# Patient Record
Sex: Female | Born: 1955 | Race: White | Hispanic: No | Marital: Married | Smoking: Never smoker
Health system: Southern US, Community
[De-identification: ages and names within clinical notes are randomized; demographics above are authoritative.]

---

## 2004-10-26 ENCOUNTER — Ambulatory Visit (HOSPITAL_COMMUNITY): Admission: RE | Admit: 2004-10-26 | Discharge: 2004-10-26 | Payer: Self-pay | Admitting: Chiropractic Medicine

## 2005-09-07 IMAGING — CR DG CERVICAL SPINE COMPLETE 4+V
6 series · 6 of 6 positions shown · non-contrast
Comparison: none

CLINICAL DATA: Neck pain, no known injury.  
 CERVICAL SPINE ? 5 VIEW:
 Normal alignment and no fracture.  There is mild cervical kyphosis.  The disc spaces are well maintained and there is no significant spurring.

[w c-spine lat]
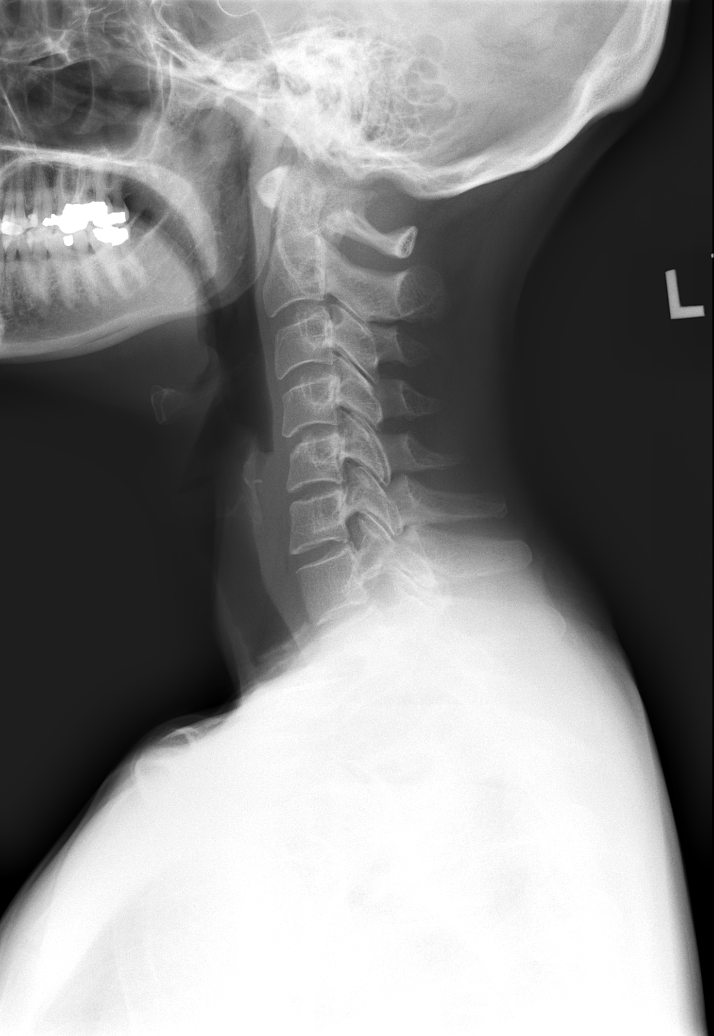

[w c-spine oblique (1 of 2)]
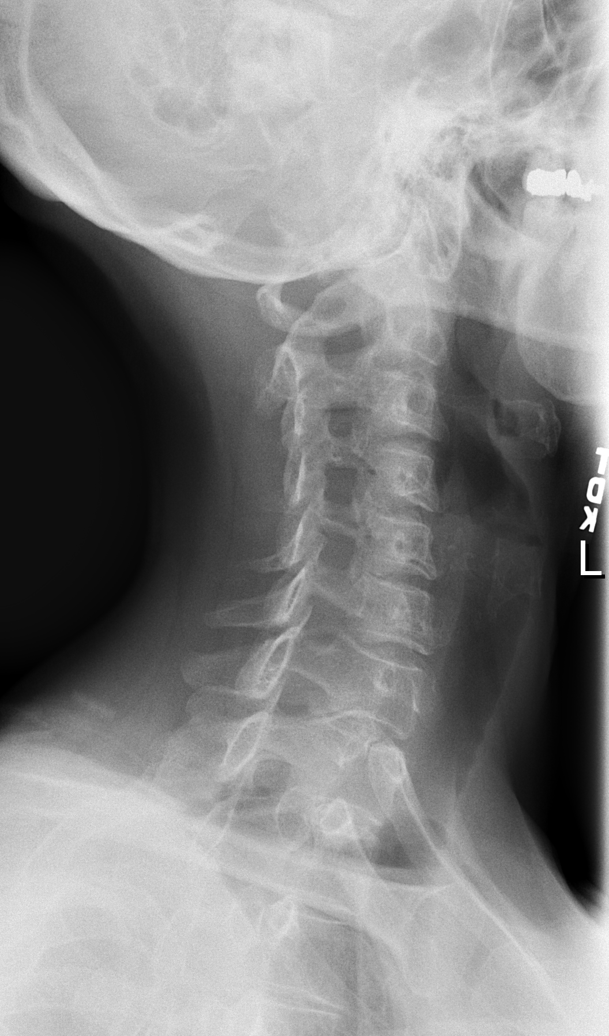

[w c-spine oblique (2 of 2)]
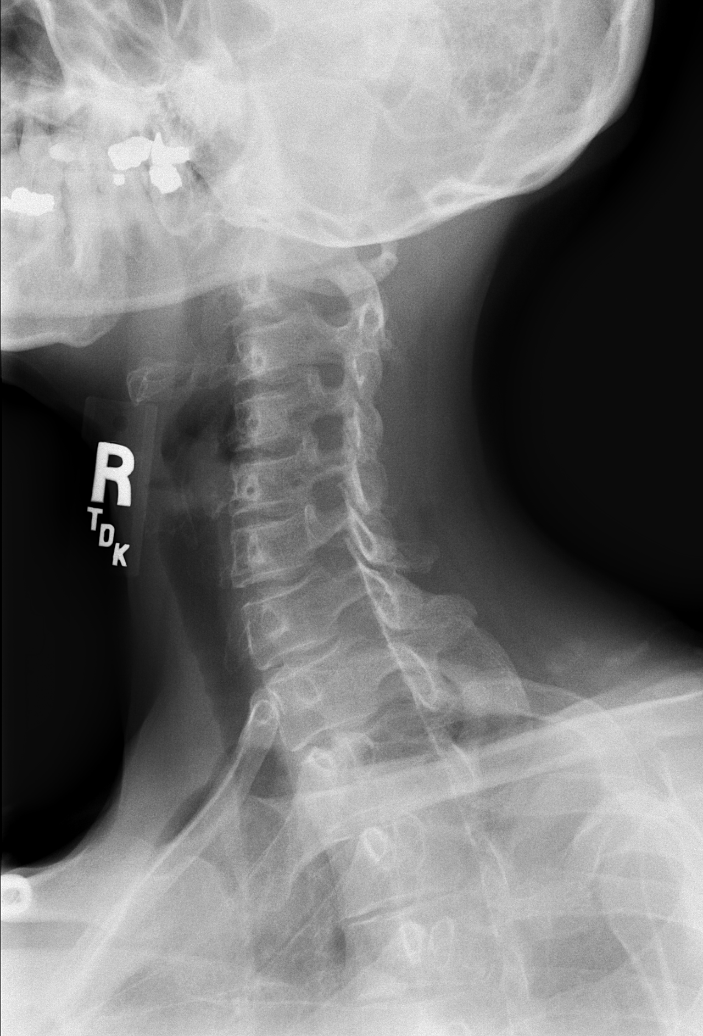

[w c-spine a.p. *]
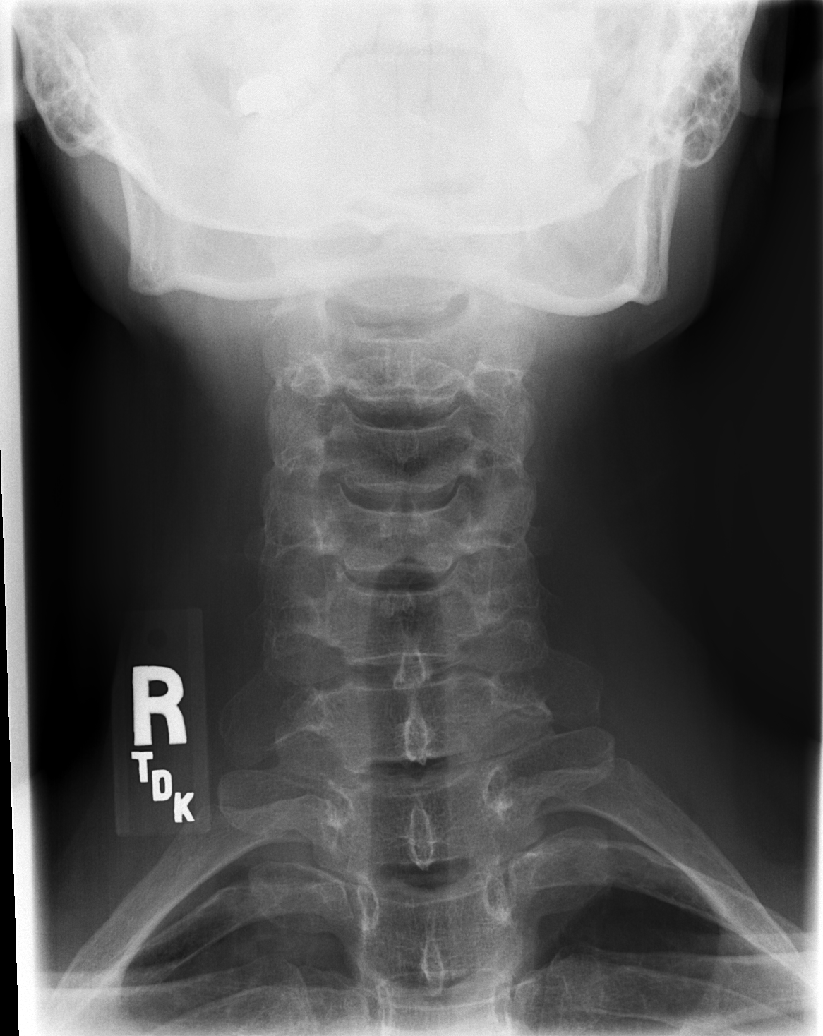

[w c-spine odontoid *]
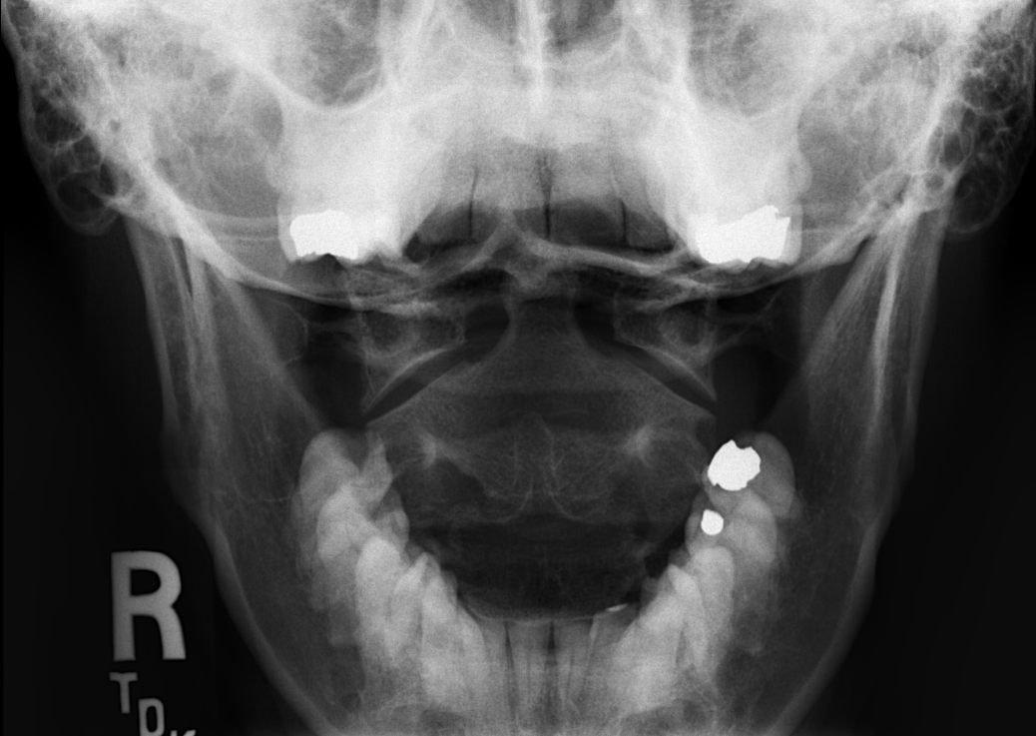

[w swimmers view *]
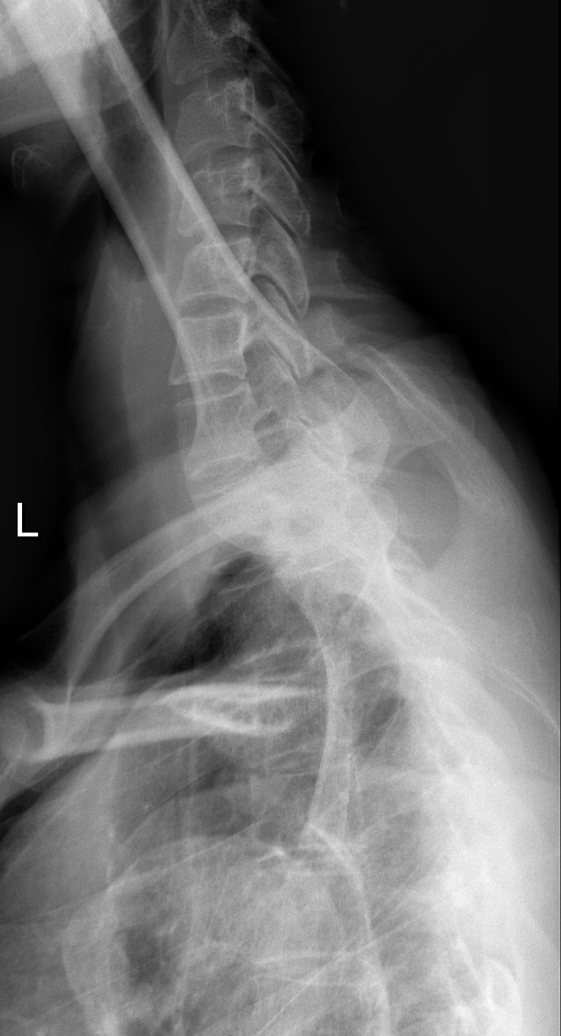

[6 of 6 positions shown; findings below may reference images not displayed]

IMPRESSION: Straightening of the cervical spine without significant degenerative change or fracture. 
 LUMBAR SPINE ? 4 VIEW: 
 Normal alignment and no fracture.  There is facet arthropathy on the left on at L5-S1 but I don?t believe there is a pars defect. Disc spaces are well maintained.
IMPRESSION: No acute bony abnormality. 
 RIGHT SHOULDER - 3 VIEW: 
 Normal alignment and no fracture.  There is mild degenerative change in the AC joint with very mild spurring present.  The glenohumeral joint is intact.
IMPRESSION: No acute bony abnormality.  Mild AC degeneration.

## 2005-09-07 IMAGING — CR DG SHOULDER 2+V*R*
3 series · 3 of 3 positions shown · non-contrast
Comparison: none

CLINICAL DATA: Neck pain, no known injury.  
 CERVICAL SPINE ? 5 VIEW:
 Normal alignment and no fracture.  There is mild cervical kyphosis.  The disc spaces are well maintained and there is no significant spurring.

[w shoulder ap external right *]
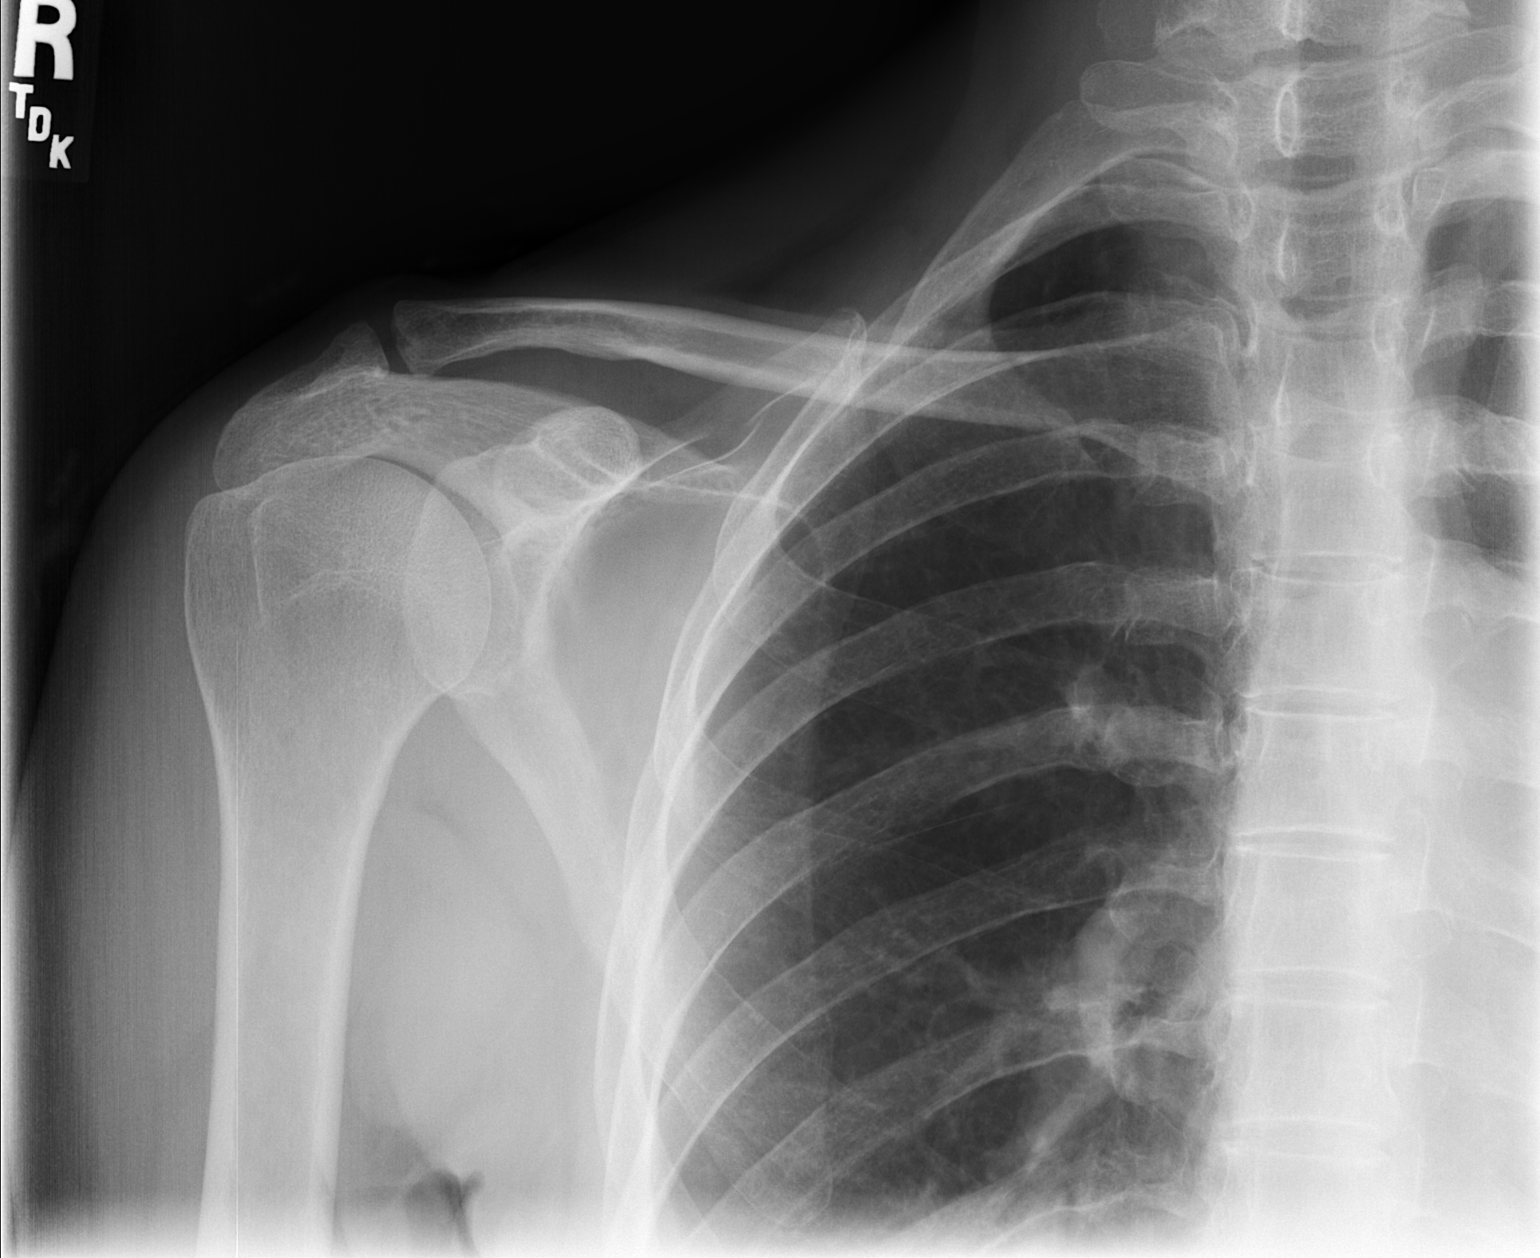

[w shoulder ap internal right *]
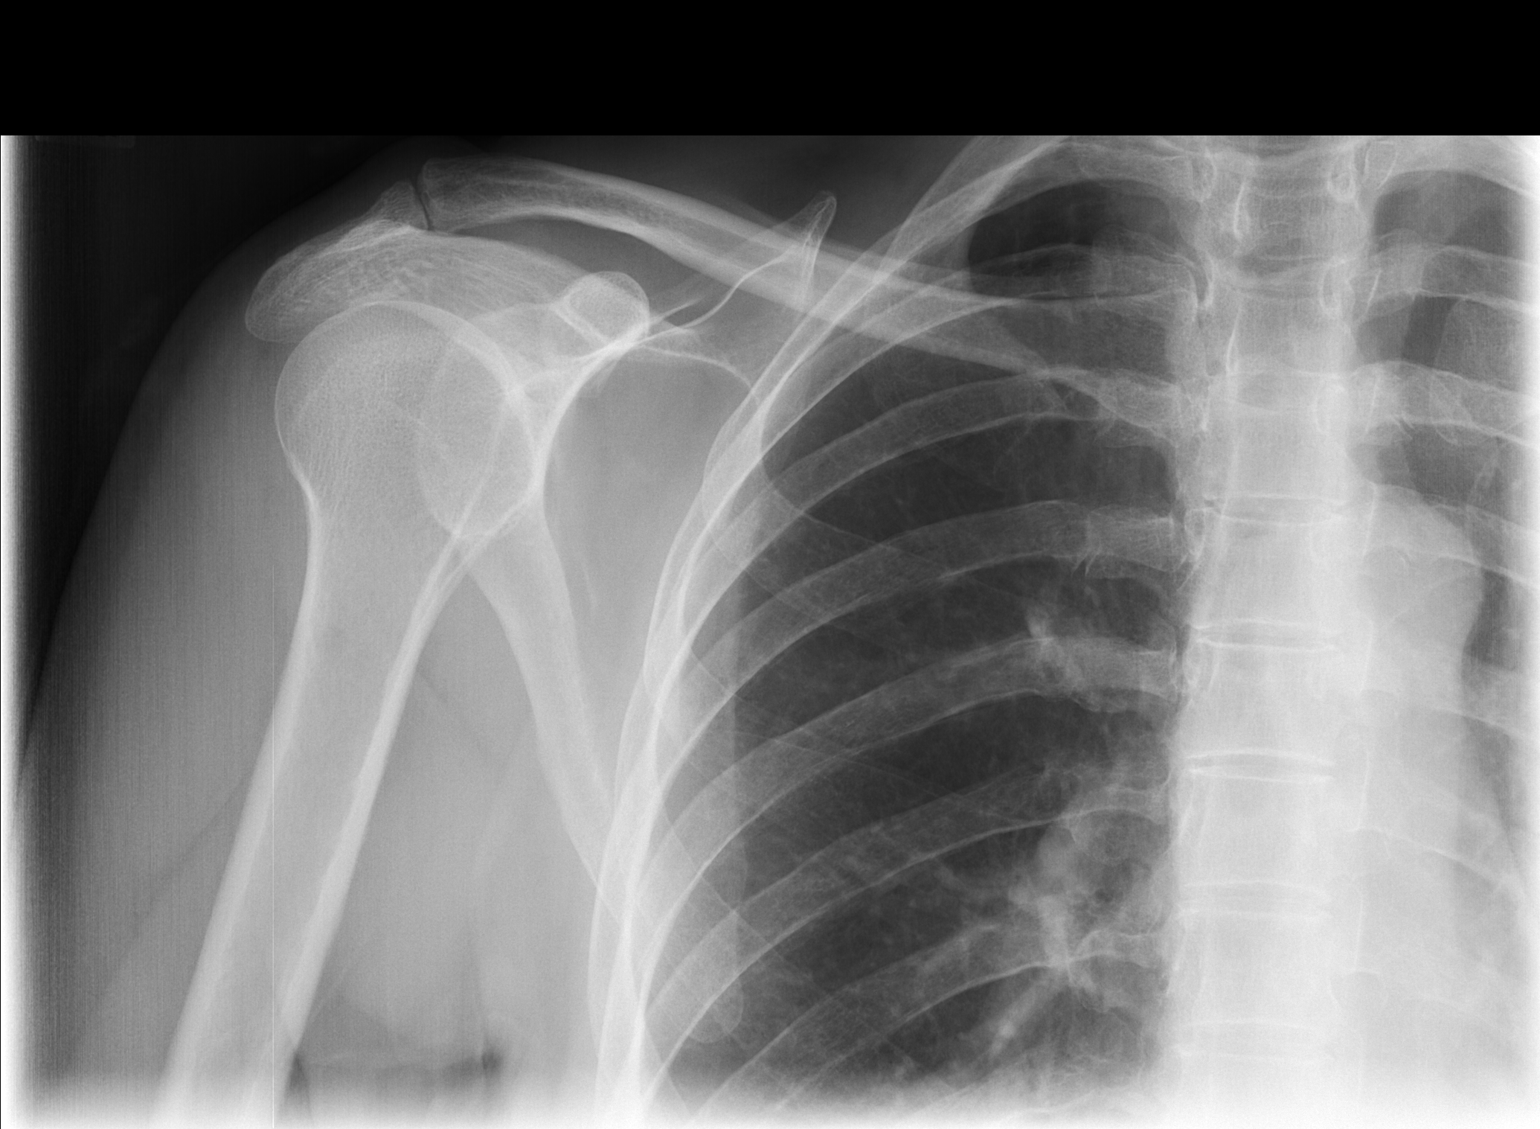

[t shoulder axillary right *]
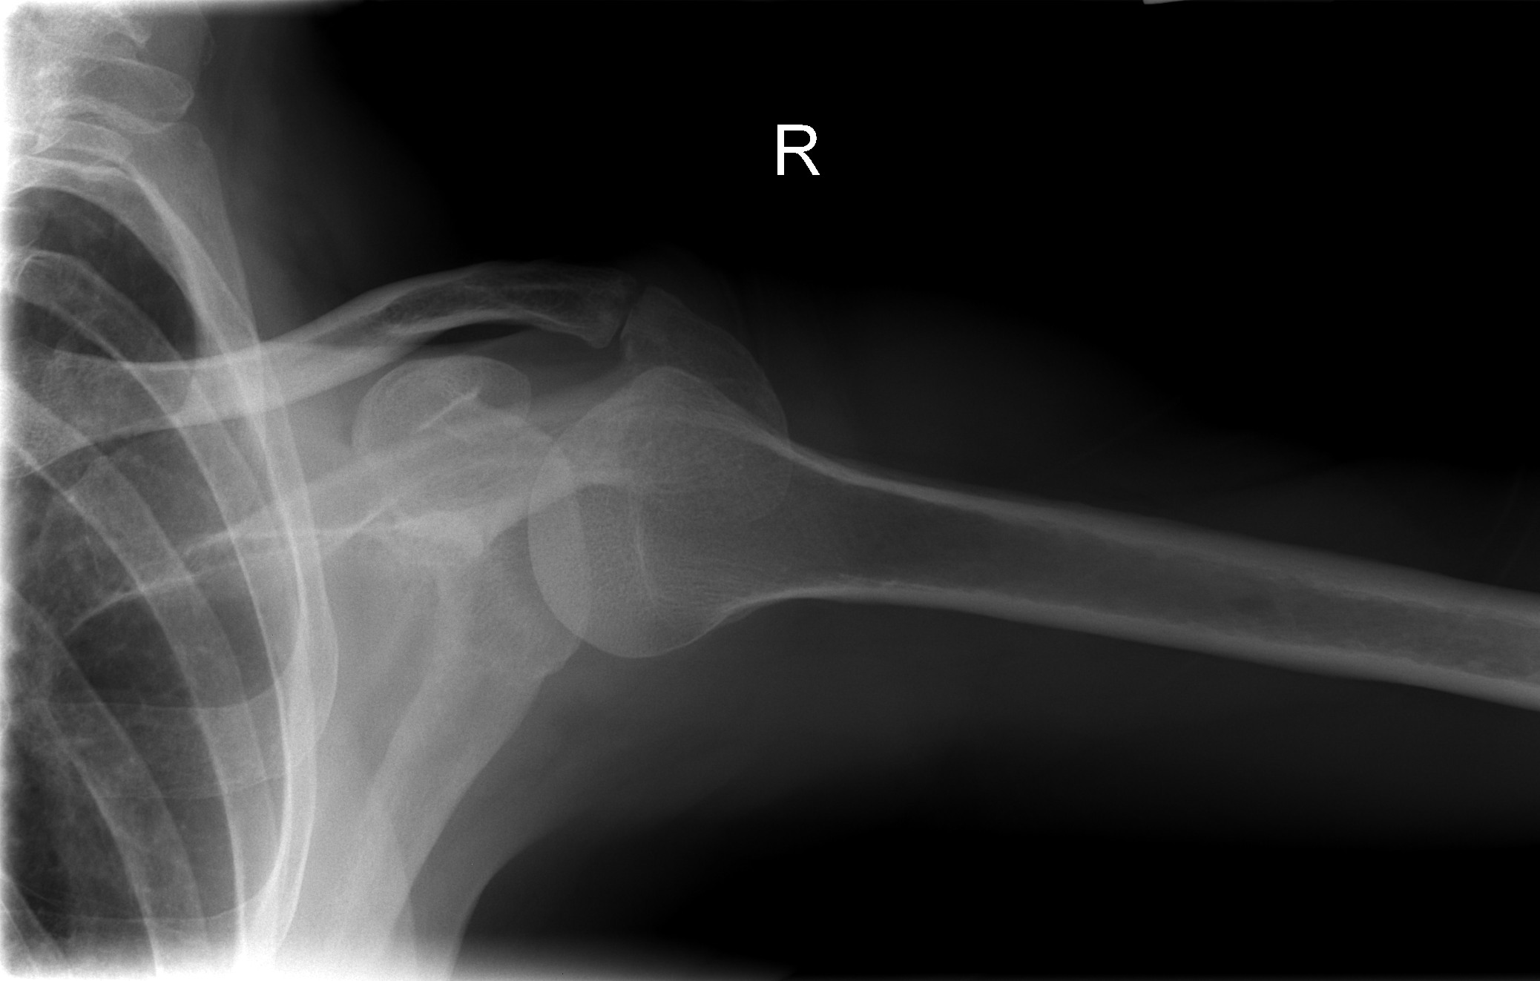

[3 of 3 positions shown; findings below may reference images not displayed]

IMPRESSION: Straightening of the cervical spine without significant degenerative change or fracture. 
 LUMBAR SPINE ? 4 VIEW: 
 Normal alignment and no fracture.  There is facet arthropathy on the left on at L5-S1 but I don?t believe there is a pars defect. Disc spaces are well maintained.
IMPRESSION: No acute bony abnormality. 
 RIGHT SHOULDER - 3 VIEW: 
 Normal alignment and no fracture.  There is mild degenerative change in the AC joint with very mild spurring present.  The glenohumeral joint is intact.
IMPRESSION: No acute bony abnormality.  Mild AC degeneration.

## 2019-10-20 ENCOUNTER — Other Ambulatory Visit: Payer: Self-pay

## 2019-10-20 ENCOUNTER — Ambulatory Visit (INDEPENDENT_AMBULATORY_CARE_PROVIDER_SITE_OTHER): Payer: PRIVATE HEALTH INSURANCE | Admitting: Dermatology

## 2019-10-20 ENCOUNTER — Encounter: Payer: Self-pay | Admitting: Dermatology

## 2019-10-20 DIAGNOSIS — D229 Melanocytic nevi, unspecified: Secondary | ICD-10-CM

## 2019-10-20 DIAGNOSIS — D225 Melanocytic nevi of trunk: Secondary | ICD-10-CM | POA: Diagnosis not present

## 2019-10-20 DIAGNOSIS — D1801 Hemangioma of skin and subcutaneous tissue: Secondary | ICD-10-CM

## 2019-10-20 DIAGNOSIS — D485 Neoplasm of uncertain behavior of skin: Secondary | ICD-10-CM

## 2019-10-20 DIAGNOSIS — C44519 Basal cell carcinoma of skin of other part of trunk: Secondary | ICD-10-CM | POA: Diagnosis not present

## 2019-10-20 DIAGNOSIS — C4491 Basal cell carcinoma of skin, unspecified: Secondary | ICD-10-CM

## 2019-10-20 HISTORY — DX: Basal cell carcinoma of skin, unspecified: C44.91

## 2019-10-20 NOTE — Patient Instructions (Signed)

## 2019-10-21 ENCOUNTER — Telehealth: Payer: Self-pay | Admitting: Dermatology

## 2019-10-21 NOTE — Telephone Encounter (Signed)
Larene Beach with [facility] needs to verify the location of site. Chart [id number]

## 2019-10-21 NOTE — Telephone Encounter (Signed)
Phone call to [facility] and they wanted to verify that the location was the right inguinal area. I informed them yes, but it was closer to the abdomen

## 2019-10-22 ENCOUNTER — Telehealth: Payer: Self-pay

## 2019-10-22 NOTE — Telephone Encounter (Signed)
-----   Message from Lavonna Monarch, MD sent at 10/22/2019  6:40 AM EDT ----- Schedule surgery with Dr. Darene Lamer

## 2019-10-22 NOTE — Telephone Encounter (Signed)
Pathology results to patient.  74minute with ST scheduled for May 13th at 9:30am.

## 2019-10-24 ENCOUNTER — Encounter: Payer: Self-pay | Admitting: Dermatology

## 2019-10-24 NOTE — Progress Notes (Signed)
   Follow-Up Visit   Subjective  Theresa Garner is a 64 y.o. female who presents for the following: Skin Problem (Place on her right mid abdomen x1 year. Sore and nonhealing.).  Growth Location: Right lower abdomen Duration: Avril months Quality: Larger Associated Signs/Symptoms: Sore Modifying Factors:  Severity:  Timing: Context:   The following portions of the chart were reviewed this encounter and updated as appropriate: Tobacco  Allergies  Meds  Problems  Med Hx  Surg Hx  Fam Hx      Objective  Well appearing patient in no apparent distress; mood and affect are within normal limits.  All skin waist up examined.   Assessment & Plan  Neoplasm of uncertain behavior of skin Right Inguinal Area  Skin / nail biopsy Type of biopsy: tangential   Anesthesia: the lesion was anesthetized in a standard fashion   Anesthetic:  1% lidocaine w/ epinephrine 1-100,000 local infiltration Instrument used: flexible razor blade   Hemostasis achieved with: ferric subsulfate   Outcome: patient tolerated procedure well   Post-procedure details: sterile dressing applied and wound care instructions given   Dressing type: petrolatum   Additional details:  Patient identified lesion of concern.  Lesion identified by physician.  Specimen 1 - Surgical pathology Differential Diagnosis: rule out AK vs scc Check Margins: No

## 2019-11-12 ENCOUNTER — Other Ambulatory Visit: Payer: Self-pay

## 2019-11-12 ENCOUNTER — Encounter: Payer: Self-pay | Admitting: Dermatology

## 2019-11-12 ENCOUNTER — Ambulatory Visit (INDEPENDENT_AMBULATORY_CARE_PROVIDER_SITE_OTHER): Payer: PRIVATE HEALTH INSURANCE | Admitting: Dermatology

## 2019-11-12 DIAGNOSIS — C4492 Squamous cell carcinoma of skin, unspecified: Secondary | ICD-10-CM

## 2019-11-12 DIAGNOSIS — C44529 Squamous cell carcinoma of skin of other part of trunk: Secondary | ICD-10-CM | POA: Diagnosis not present

## 2019-11-12 NOTE — Patient Instructions (Addendum)
Biopsy, Surgery (Curettage) & Surgery (Excision) Aftercare Instructions  1. Okay to remove bandage in 24 hours  2. Wash area with soap and water  3. Apply Vaseline to area twice daily until healed (Not Neosporin)  4. Okay to cover with a Band-Aid to decrease the chance of infection or prevent irritation from clothing; also it's okay to uncover lesion at home.  5. Suture instructions: return to our office in 7-10 or 10-14 days for a nurse visit for suture removal. Variable healing with sutures, if pain or itching occurs call our office. It's okay to shower or bathe 24 hours after sutures are given.  6. The following risks may occur after a biopsy, curettage or excision: bleeding, scarring, discoloration, recurrence, infection (redness, yellow drainage, pain or swelling). 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 The wound healed quite well after biopsy and was no longer draining. Triple curettage so showed more with than depth so the small deep pocket was cauterized and recuretted and the entire lesion treated with parenteral fluorouracil. I told Ms. Szabo she can expect to have a small crust for approximately 2 to 3 weeks. There are no restrictions for her regular workouts. Soap and water followed by daily application of either triple antibiotic ointment or Vaseline with a bandage for the first few days. I have requested message by phone her if she goes on MyChart to let me know that it is mostly healed in 2 to 3 weeks and I hope to recheck the area 1 time in the winter. My cell phone number is [phone]. 7. For questions, concerns and results call our office at [place] before 4pm & Friday before 3pm. Biopsy results will be available in 1 week.

## 2019-11-14 NOTE — Progress Notes (Addendum)
   Follow-Up Visit   Subjective  Theresa Garner is a 64 y.o. female who presents for the following: Procedure (Patient here today for treatment on BCC right inguinal area.  Patient declined gown.).  Basosquamous carcinoma Location: Right abdomen Duration:  Quality:  Associated Signs/Symptoms: Modifying Factors:  Severity:  Timing: Context: For treatment  The following portions of the chart were reviewed this encounter and updated as appropriate:     Objective  Well appearing patient in no apparent distress; mood and affect are within normal limits.  Abdomen examined.   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  Destruction of lesion Complexity: simple   Destruction method: electrodesiccation and curettage   Informed consent: discussed and consent obtained   Timeout:  patient name, date of birth, surgical site, and procedure verified Anesthesia: the lesion was anesthetized in a standard fashion   Anesthetic:  1% lidocaine w/ epinephrine 1-100,000 local infiltration Curettage performed in three different directions: Yes   Curettage cycles:  3 Lesion length (cm):  1.3 Lesion width (cm):  1 Margin per side (cm):  0 Final wound size (cm):  1.3 Hemostasis achieved with:  ferric subsulfate Outcome: patient tolerated procedure well with no complications   Post-procedure details: wound care instructions given   Additional details:  Inoculated with parenteral 5% fluorouracil  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 The wound healed quite well after biopsy and was no longer draining. Triple curettage so showed more with than depth so the small deep pocket was cauterized and recuretted and the entire lesion treated with parenteral fluorouracil. I told [name] she can expect to have a small crust for approximately 2 to 3 weeks. There are no restrictions for her regular workouts. Soap and water followed by daily  application of either triple antibiotic ointment or Vaseline with a bandage for the first few days. I have requested message by phone her if she goes on MyChart to let me know that it is mostly healed in 2 to 3 weeks and I hope to recheck the area 1 time in the winter. My cell phone number is [phone].
